# Patient Record
Sex: Female | Born: 1985 | Hispanic: Yes | Marital: Single | Smoking: Current every day smoker
Health system: Southern US, Community
[De-identification: ages and names within clinical notes are randomized; demographics above are authoritative.]

## PROBLEM LIST (undated history)

## (undated) DIAGNOSIS — F410 Panic disorder [episodic paroxysmal anxiety] without agoraphobia: Secondary | ICD-10-CM

## (undated) DIAGNOSIS — F4323 Adjustment disorder with mixed anxiety and depressed mood: Secondary | ICD-10-CM

## (undated) DIAGNOSIS — M419 Scoliosis, unspecified: Secondary | ICD-10-CM

## (undated) DIAGNOSIS — M25511 Pain in right shoulder: Secondary | ICD-10-CM

## (undated) DIAGNOSIS — M797 Fibromyalgia: Secondary | ICD-10-CM

## (undated) DIAGNOSIS — G8929 Other chronic pain: Secondary | ICD-10-CM

## (undated) DIAGNOSIS — J42 Unspecified chronic bronchitis: Secondary | ICD-10-CM

## (undated) DIAGNOSIS — G5600 Carpal tunnel syndrome, unspecified upper limb: Secondary | ICD-10-CM

## (undated) HISTORY — PX: DILATION AND CURETTAGE OF UTERUS: SHX78

---

## 2010-11-07 ENCOUNTER — Encounter: Payer: Self-pay | Admitting: *Deleted

## 2010-11-07 DIAGNOSIS — M4120 Other idiopathic scoliosis, site unspecified: Secondary | ICD-10-CM | POA: Insufficient documentation

## 2010-11-07 DIAGNOSIS — M549 Dorsalgia, unspecified: Secondary | ICD-10-CM | POA: Insufficient documentation

## 2010-11-07 LAB — URINE MICROSCOPIC-ADD ON

## 2010-11-07 LAB — URINALYSIS, ROUTINE W REFLEX MICROSCOPIC
Bilirubin Urine: NEGATIVE
Glucose, UA: NEGATIVE mg/dL
Ketones, ur: 15 mg/dL — AB
Leukocytes, UA: NEGATIVE
Nitrite: NEGATIVE
Protein, ur: 30 mg/dL — AB
Specific Gravity, Urine: 1.043 — ABNORMAL HIGH (ref 1.005–1.030)
Urobilinogen, UA: 0.2 mg/dL (ref 0.0–1.0)
pH: 6 (ref 5.0–8.0)

## 2010-11-07 LAB — PREGNANCY, URINE: Preg Test, Ur: NEGATIVE

## 2010-11-07 NOTE — ED Notes (Signed)
Pt states she has had low back pain onset this a.m. Also c/o H/A. States she passed out 2 days ago and was seen at [facility]. Hx scoliosis.

## 2010-11-08 ENCOUNTER — Encounter (HOSPITAL_BASED_OUTPATIENT_CLINIC_OR_DEPARTMENT_OTHER): Payer: Self-pay

## 2010-11-08 ENCOUNTER — Emergency Department (INDEPENDENT_AMBULATORY_CARE_PROVIDER_SITE_OTHER): Payer: Medicaid Other

## 2010-11-08 ENCOUNTER — Emergency Department (HOSPITAL_BASED_OUTPATIENT_CLINIC_OR_DEPARTMENT_OTHER)
Admission: EM | Admit: 2010-11-08 | Discharge: 2010-11-08 | Disposition: A | Discharge status: Home / Self Care | Payer: Medicaid Other | Attending: Emergency Medicine | Admitting: Emergency Medicine

## 2010-11-08 DIAGNOSIS — M545 Low back pain: Secondary | ICD-10-CM

## 2010-11-08 DIAGNOSIS — M4120 Other idiopathic scoliosis, site unspecified: Secondary | ICD-10-CM

## 2010-11-08 DIAGNOSIS — R55 Syncope and collapse: Secondary | ICD-10-CM

## 2010-11-08 DIAGNOSIS — M419 Scoliosis, unspecified: Secondary | ICD-10-CM

## 2010-11-08 DIAGNOSIS — M549 Dorsalgia, unspecified: Secondary | ICD-10-CM

## 2010-11-08 DIAGNOSIS — R109 Unspecified abdominal pain: Secondary | ICD-10-CM

## 2010-11-08 HISTORY — DX: Scoliosis, unspecified: M41.9

## 2010-11-08 MED ORDER — KETOROLAC TROMETHAMINE 15 MG/ML IJ SOLN
15.0000 mg | Freq: Once | INTRAMUSCULAR | Status: AC
Start: 2010-11-08 — End: 2010-11-08
  Administered 2010-11-08: 15 mg via INTRAVENOUS
  Filled 2010-11-08: qty 1

## 2010-11-08 MED ORDER — SODIUM CHLORIDE 0.9 % IV SOLN
Freq: Once | INTRAVENOUS | Status: AC
  Administered 2010-11-08: 02:00:00 via INTRAVENOUS

## 2010-11-08 MED ORDER — ONDANSETRON HCL 4 MG/2ML IJ SOLN
4.0000 mg | Freq: Once | INTRAMUSCULAR | Status: AC
Start: 2010-11-08 — End: 2010-11-08
  Administered 2010-11-08: 4 mg via INTRAVENOUS
  Filled 2010-11-08: qty 2

## 2010-11-08 MED ORDER — HYDROMORPHONE HCL 1 MG/ML IJ SOLN
1.0000 mg | Freq: Once | INTRAMUSCULAR | Status: AC
  Administered 2010-11-08: 1 mg via INTRAVENOUS
  Filled 2010-11-08: qty 1

## 2010-11-08 MED ORDER — OXYCODONE-ACETAMINOPHEN 7.5-325 MG PO TABS: 1.0000 | ORAL_TABLET | ORAL | Status: AC | PRN

## 2010-11-08 NOTE — ED Provider Notes (Addendum)
History     CSN: [id number] Arrival date & time: 11/08/2010 12:19 AM  Chief Complaint  Patient presents with  . Back Pain   HPI This is a 25 year old white female with a history of back pain that started yesterday morning. It initially was felt in the lower lumbar region but has radiated to the right flank. It is constant but waxes and wanes. She comparison to labor pain and is severe at its worst. She was not aware of any hematuria. She is not on her menses at this time. She denies vaginal bleeding or discharge. She denies fever or chills. She has been nauseated but not vomited with the pain. There is equivocal worsening of the pain with movement. She has never had similar pain in the past. She denies back injury or recent lifting.   Past Medical History  Diagnosis Date  . Scoliosis     Past Surgical History  Procedure Date  . Dilation and curettage of uterus     History reviewed. No pertinent family history.  History  Substance Use Topics  . Smoking status: Current Everyday Smoker  . Smokeless tobacco: Not on file  . Alcohol Use: Yes    OB History    Grav Para Term Preterm Abortions TAB SAB Ect Mult Living                  Review of Systems  All other systems reviewed and are negative.    Physical Exam  BP 101/85  Pulse 82  Temp(Src) 98 F (36.7 C) (Oral)  Resp 18  Ht 4\' 11"  (1.499 m)  Wt 107 lb (48.535 kg)  BMI 21.61 kg/m2  SpO2 100%  LMP 10/20/2010  Physical Exam General: Well-developed, well-nourished female in no acute distress; appearance consistent with age of record HENT: normocephalic, atraumatic Eyes: pupils equal round and reactive to light; extraocular muscles intact Neck: supple Heart: regular rate and rhythm; no murmurs, rubs or gallops Lungs: clear to auscultation bilaterally Abdomen: soft; mild right suprapubic tenderness tender; nondistended; no masses or hepatosplenomegaly; bowel sounds present Genitourinary: Mild right CVA  tenderness Extremities: No deformity; full range of motion; pulses normal Neurologic: Awake, alert and oriented;motor function intact in all extremities and symmetric;sensation grossly intact; no facial droop Skin: Warm and dry Psychiatric: Normal mood and affect   ED Course  Procedures  MDM Nursing notes and vitals signs, including pulse oximetry, reviewed.  Summary of this visit's results, reviewed by myself:  Labs:  Results for orders placed during the hospital encounter of 11/08/10  URINALYSIS, ROUTINE W REFLEX MICROSCOPIC      Component Value Range   Color, Urine YELLOW  YELLOW    Appearance CLOUDY (*) CLEAR    Specific Gravity, Urine 1.043 (*) 1.005 - 1.030    pH 6.0  5.0 - 8.0    Glucose, UA NEGATIVE  NEGATIVE (mg/dL)   Hgb urine dipstick LARGE (*) NEGATIVE    Bilirubin Urine NEGATIVE  NEGATIVE    Ketones, ur 15 (*) NEGATIVE (mg/dL)   Protein, ur 30 (*) NEGATIVE (mg/dL)   Urobilinogen, UA 0.2  0.0 - 1.0 (mg/dL)   Nitrite NEGATIVE  NEGATIVE    Leukocytes, UA NEGATIVE  NEGATIVE   PREGNANCY, URINE      Component Value Range   Preg Test, Ur NEGATIVE    URINE MICROSCOPIC-ADD ON      Component Value Range   Squamous Epithelial / LPF RARE  RARE    WBC, UA 0-2  <3 (WBC/hpf)  RBC / HPF TOO NUMEROUS TO COUNT  <3 (RBC/hpf)   Bacteria, UA RARE  RARE    Urine-Other MUCOUS PRESENT      Imaging Studies: Ct Abdomen Pelvis Wo Contrast  11/08/2010  *RADIOLOGY REPORT*  Clinical Data:  Low back pain, syncope, right flank pain, history scoliosis  CT ABDOMEN AND PELVIS WITHOUT CONTRAST  Technique:  Multidetector CT imaging of the abdomen and pelvis was performed following the standard protocol without intravenous contrast. Sagittal and coronal MPR images reconstructed from axial data set.  Comparison: None  Findings: Lung bases clear. Thoracolumbar deformity secondary to the rotary scoliosis of the thoracolumbar spine. Unremarkable kidneys without mass, hydronephrosis or calcification.  No definite ureteral calcification or dilatation. Bladder unremarkable. Liver, spleen, pancreas, and adrenal glands grossly normal appearance for technique. Suboptimal assessment stomach and bowel loops due to lack of IV and oral contrast and lack of intra abdominal fat planes, no gross abnormalities seen. No definite mass, adenopathy, free fluid, or inflammatory process. No acute bony findings.  IMPRESSION: Scoliosis. No definite acute intra abdominal or intrapelvic abnormalities.  Original Report Authenticated By: Lollie Marrow, M.D.          Hanley Seamen, MD 11/08/10 0250  Hanley Seamen, MD 11/08/10 [phone]

## 2010-11-08 NOTE — ED Notes (Signed)
When this RN entered pt's room, pt was asleep and had to be stimulated to wake up.  Pt immediately stated that she was in the worst pain of her life, that "it feels like labor pains".  Pt remained drowsy throughout IV insertion and medication administration.

## 2016-05-25 ENCOUNTER — Emergency Department (HOSPITAL_BASED_OUTPATIENT_CLINIC_OR_DEPARTMENT_OTHER): Payer: Medicaid Other

## 2016-05-25 ENCOUNTER — Emergency Department (HOSPITAL_BASED_OUTPATIENT_CLINIC_OR_DEPARTMENT_OTHER)
Admission: EM | Admit: 2016-05-25 | Discharge: 2016-05-26 | Disposition: A | Discharge status: Home / Self Care | Payer: Medicaid Other | Attending: Emergency Medicine | Admitting: Emergency Medicine

## 2016-05-25 ENCOUNTER — Encounter (HOSPITAL_BASED_OUTPATIENT_CLINIC_OR_DEPARTMENT_OTHER): Payer: Self-pay | Admitting: Emergency Medicine

## 2016-05-25 DIAGNOSIS — R05 Cough: Secondary | ICD-10-CM | POA: Diagnosis present

## 2016-05-25 DIAGNOSIS — F17200 Nicotine dependence, unspecified, uncomplicated: Secondary | ICD-10-CM | POA: Diagnosis not present

## 2016-05-25 DIAGNOSIS — B9789 Other viral agents as the cause of diseases classified elsewhere: Secondary | ICD-10-CM

## 2016-05-25 DIAGNOSIS — J069 Acute upper respiratory infection, unspecified: Secondary | ICD-10-CM | POA: Diagnosis not present

## 2016-05-25 DIAGNOSIS — Z79899 Other long term (current) drug therapy: Secondary | ICD-10-CM | POA: Diagnosis not present

## 2016-05-25 HISTORY — DX: Fibromyalgia: M79.7

## 2016-05-25 HISTORY — DX: Other chronic pain: G89.29

## 2016-05-25 HISTORY — DX: Carpal tunnel syndrome, unspecified upper limb: G56.00

## 2016-05-25 HISTORY — DX: Adjustment disorder with mixed anxiety and depressed mood: F43.23

## 2016-05-25 HISTORY — DX: Pain in right shoulder: M25.511

## 2016-05-25 HISTORY — DX: Panic disorder (episodic paroxysmal anxiety): F41.0

## 2016-05-25 HISTORY — DX: Unspecified chronic bronchitis: J42

## 2016-05-25 NOTE — ED Notes (Signed)
Pt taken to treatment room and appeared very drowsy. Pt had to awakened x 2 to move to stretcher from wheel chair. Pt dropped drink in floor.

## 2016-05-25 NOTE — ED Provider Notes (Signed)
By signing my name below, I, Freida Busman, attest that this documentation has been prepared under the direction and in the presence of Davionna Blacksher N Odena Mcquaid, DO . Electronically Signed: Freida Busman, Scribe. 05/26/2016. 12:13 AM.   TIME SEEN: 12:06 AM  CHIEF COMPLAINT:  Chief Complaint  Patient presents with  . Cough  . Chest Pain     HPI: HPI Comments:  Rose Willis is a 31 y.o. female with reported history of chronic bronchitis, tobacco use who presents to the Emergency Department complaining of chest heaviness x 1 day. She reports difficulty taking a deep breath. She denies fever. Pt reports associated cough productive of deep green sputum and SOB. She reports h/o chronic bronchitis; has been using inhaler with little relief. Pt is a current smoker. She denies recent fractures but notes she sprained her left ankle ~ 2 weeks ago and has been ambulating with the aid of crutches and is in a boot. Denies calf tenderness or calf swelling. No history of PE or DVT.   ROS: See HPI Constitutional: no fever  Eyes: no drainage  ENT: no runny nose   Cardiovascular:  chest pain  Resp: SOB  GI: no vomiting GU: no dysuria Integumentary: no rash  Allergy: no hives  Musculoskeletal: no leg swelling  Neurological: no slurred speech ROS otherwise negative  PAST MEDICAL HISTORY/PAST SURGICAL HISTORY:  Past Medical History:  Diagnosis Date  . Adjustment disorder with mixed anxiety and depressed mood   . Carpal tunnel syndrome   . Chronic bronchitis (HCC)   . Chronic right shoulder pain   . Fibromyalgia   . Panic attack   . Scoliosis   . Scoliosis     MEDICATIONS:  Prior to Admission medications   Medication Sig Start Date End Date Taking? Authorizing Provider  albuterol (PROVENTIL HFA;VENTOLIN HFA) 108 (90 Base) MCG/ACT inhaler Inhale 2 puffs into the lungs every 6 (six) hours as needed for wheezing or shortness of breath.   Yes Historical Provider, MD  atenolol (TENORMIN) 25 MG tablet  Take 25 mg by mouth daily.   Yes Historical Provider, MD  diclofenac (VOLTAREN) 75 MG EC tablet Take 75 mg by mouth 2 (two) times daily.   Yes Historical Provider, MD  gabapentin (NEURONTIN) 300 MG capsule Take 300 mg by mouth 3 (three) times daily.    Yes Historical Provider, MD  levonorgestrel (MIRENA) 20 MCG/24HR IUD 1 each by Intrauterine route once.   Yes Historical Provider, MD  Milnacipran (SAVELLA) 50 MG TABS tablet Take 50 mg by mouth 2 (two) times daily.   Yes Historical Provider, MD  SUMAtriptan (IMITREX) 100 MG tablet Take 100 mg by mouth every 2 (two) hours as needed for migraine. May repeat in 2 hours if headache persists or recurs.   Yes Historical Provider, MD  topiramate (TOPAMAX) 25 MG capsule Take 25 mg by mouth as directed.   Yes Historical Provider, MD    ALLERGIES:  No Known Allergies  SOCIAL HISTORY:  Social History  Substance Use Topics  . Smoking status: Current Every Day Smoker  . Smokeless tobacco: Never Used  . Alcohol use Yes    FAMILY HISTORY: No family history on file.  EXAM: BP 120/91 (BP Location: Right Arm)   Pulse 100   Temp 98.5 F (36.9 C) (Oral)   Resp 18   Ht 5\' 1"  (1.549 m)   Wt 115 lb (52.2 kg)   LMP 05/24/2016 (Exact Date)   SpO2 100%   BMI 21.73 kg/m  CONSTITUTIONAL:  Sleepy bit arousable; oriented and responds appropriately to questions. Well-appearing; well-nourished, Does not appear intoxicated, afebrile and nontoxic appearing HEAD: Normocephalic EYES: Conjunctivae clear, PERRL, EOMI ENT: normal nose; no rhinorrhea; moist mucous membranes NECK: Supple, no meningismus, no nuchal rigidity, no LAD  CARD: RRR; S1 and S2 appreciated; no murmurs, no clicks, no rubs, no gallops RESP: Normal chest excursion without splinting or tachypnea; diminished breath sounds bilaterally; no wheezes, no rhonchi, no rales, no hypoxia or respiratory distress, speaking full sentences ABD/GI: Normal bowel sounds; non-distended; soft, non-tender, no  rebound, no guarding, no peritoneal signs, no hepatosplenomegaly BACK:  The back is non-tender to palpation, there is no CVA tenderness. She has significant scoliosis.  EXT: Normal ROM in all joints; non-tender to palpation; no edema; normal capillary refill; no cyanosis, no calf tenderness or swelling. Left foot is in a boot    SKIN: Normal color for age and race; warm; no rash NEURO: Moves all extremities equally, sensation to light touch intact diffusely, cranial nerves II through XII intact, normal speech PSYCH: The patient's mood and manner are appropriate. Grooming and personal hygiene are appropriate.  MEDICAL DECISION MAKING: Patient here with symptoms of viral URI. Chest x-ray obtained in triage is unremarkable.  Patient has chest pain with coughing. I do not think this is pneumonia. I do not feel she needs antibiotics. She states she feels a breathing treatment will make her feel better. We'll give DuoNeb, prednisone and reassess. We'll also give guaifenesin. Given she does have some chest pain although mostly with coughing, shortness of breath and her foot is in a boot, will obtain a d-dimer to rule out PE although much suspicion for this is low.  ED PROGRESS: Patient's labs have been unremarkable. Negative troponin and negative d-dimer. She reports feeling much better. No hypoxia or increased work of breathing here. She is refusing prednisone stating that she has taken it before and it caused her to have tachycardia immediately after taking it. Discussed with her that this is likely an adverse reaction and if it occurred immediately after taking the medication and was unlikely prednisone that caused her symptoms. She had no hives, lip or tongue swelling at that time. She agrees to a dose of Decadron here and has tolerated without difficulty. I feel she is safe to be discharged home and follow up with her primary care physician. She verbalized understanding and is comfortable with this  plan.   Unfortunately an EKG was not obtained on this patient prior to being discharged. Very low suspicion for ACS given chest pain was with coughing, she is young without risk factors. She did have a negative troponin and d-dimer here. Again suspect viral URI.   At this time, I do not feel there is any life-threatening condition present. I have reviewed and discussed all results (EKG, imaging, lab, urine as appropriate) and exam findings with patient/family. I have reviewed nursing notes and appropriate previous records.  I feel the patient is safe to be discharged home without further emergent workup and can continue workup as an outpatient as needed. Discussed usual and customary return precautions. Patient/family verbalize understanding and are comfortable with this plan.  Outpatient follow-up has been provided. All questions have been answered.  I personally performed the services described in this documentation, which was scribed in my presence. The recorded information has been reviewed and is accurate.      Layla MawKristen N Devann Cribb, DO 05/26/16 [phone]

## 2016-05-25 NOTE — ED Triage Notes (Signed)
Pt c/o tightness in chest, productive cough and shob. Pt states she used her inhaler just before coming to ED without relief. Pt has hx of chronic bronchitis.

## 2016-05-25 NOTE — ED Triage Notes (Signed)
Pt visualized in lobby to be sleeping in wheel chair. NAD noted.

## 2016-05-25 NOTE — ED Triage Notes (Signed)
Nurse first-pt c/o chest tightness, SOB-reports hx of chronic bronchitis-states she has been using her inhaler-NAD-O2 sat 100%, HR 98, RR 18-pt has cam walker on left LE and walking on crutches-placed in w/c

## 2016-05-26 LAB — CBC WITH DIFFERENTIAL/PLATELET
Basophils Absolute: 0 10*3/uL (ref 0.0–0.1)
Basophils Relative: 1 %
EOS ABS: 0.3 10*3/uL (ref 0.0–0.7)
Eosinophils Relative: 4 %
HCT: 39.7 % (ref 36.0–46.0)
Hemoglobin: 13.3 g/dL (ref 12.0–15.0)
Lymphocytes Relative: 21 %
Lymphs Abs: 1.8 10*3/uL (ref 0.7–4.0)
MCH: 32.1 pg (ref 26.0–34.0)
MCHC: 33.5 g/dL (ref 30.0–36.0)
MCV: 95.9 fL (ref 78.0–100.0)
MONO ABS: 0.8 10*3/uL (ref 0.1–1.0)
MONOS PCT: 10 %
Neutro Abs: 5.5 10*3/uL (ref 1.7–7.7)
Neutrophils Relative %: 64 %
Platelets: 216 10*3/uL (ref 150–400)
RBC: 4.14 MIL/uL (ref 3.87–5.11)
RDW: 12.9 % (ref 11.5–15.5)
WBC: 8.5 10*3/uL (ref 4.0–10.5)

## 2016-05-26 LAB — BASIC METABOLIC PANEL
Anion gap: 7 (ref 5–15)
BUN: 15 mg/dL (ref 6–20)
CALCIUM: 8.8 mg/dL — AB (ref 8.9–10.3)
CO2: 24 mmol/L (ref 22–32)
Chloride: 106 mmol/L (ref 101–111)
Creatinine, Ser: 0.86 mg/dL (ref 0.44–1.00)
GFR calc non Af Amer: >60 mL/min (ref >60)
Glucose, Bld: 111 mg/dL — ABNORMAL HIGH (ref 65–99)
Potassium: 3.6 mmol/L (ref 3.5–5.1)
SODIUM: 137 mmol/L (ref 135–145)

## 2016-05-26 LAB — D-DIMER, QUANTITATIVE (NOT AT ARMC): D DIMER QUANT: 0.35 ug{FEU}/mL (ref 0.00–0.50)

## 2016-05-26 LAB — TROPONIN I

## 2016-05-26 MED ORDER — PREDNISONE 20 MG PO TABS: 60.0000 mg | ORAL_TABLET | Freq: Every day | ORAL | 0 refills | Status: DC

## 2016-05-26 MED ORDER — GUAIFENESIN 100 MG/5ML PO LIQD: 100.0000 mg | ORAL | 0 refills | Status: AC | PRN

## 2016-05-26 MED ORDER — ALBUTEROL SULFATE HFA 108 (90 BASE) MCG/ACT IN AERS: 1.0000 | INHALATION_SPRAY | Freq: Four times a day (QID) | RESPIRATORY_TRACT | 0 refills | Status: DC | PRN

## 2016-05-26 MED ORDER — IPRATROPIUM-ALBUTEROL 0.5-2.5 (3) MG/3ML IN SOLN
3.0000 mL | RESPIRATORY_TRACT | Status: DC
  Administered 2016-05-26: 3 mL via RESPIRATORY_TRACT
  Filled 2016-05-26: qty 3

## 2016-05-26 MED ORDER — IBUPROFEN 800 MG PO TABS
800.0000 mg | ORAL_TABLET | Freq: Once | ORAL | Status: AC
  Administered 2016-05-26: 800 mg via ORAL
  Filled 2016-05-26: qty 1

## 2016-05-26 MED ORDER — GUAIFENESIN 100 MG/5ML PO SOLN
10.0000 mL | Freq: Once | ORAL | Status: AC
  Administered 2016-05-26: 200 mg via ORAL
  Filled 2016-05-26: qty 10

## 2016-05-26 MED ORDER — DEXAMETHASONE 6 MG PO TABS
10.0000 mg | ORAL_TABLET | Freq: Once | ORAL | Status: AC
  Administered 2016-05-26: 10 mg via ORAL
  Filled 2016-05-26: qty 1

## 2016-05-26 MED ORDER — PREDNISONE 50 MG PO TABS
60.0000 mg | ORAL_TABLET | Freq: Once | ORAL | Status: DC
  Filled 2016-05-26: qty 1

## 2016-05-26 NOTE — ED Notes (Addendum)
Pt reports cough nasal congestion and chest pain x 18 hours.reports that she has had to use her inhaler every couple of hours  Pt is difficult to arouse, initially confused about reason for visit keeps repeating something about children.  pt continues to fall asleep during assessment. When asked about excessive sleepiness pt reports that she has not slept in 48 hours.

## 2016-05-26 NOTE — Discharge Instructions (Signed)
To find a primary care or specialty doctor please call [phone] or [phone] to access "[facility] Find a Doctor Service." ° °You may also go on the [facility] website at [email or web address] ° °There are also multiple [facility], [facility], [facility] and [facility] practices throughout the [place] that are frequently accepting new patients. You may find a clinic that is close to your home and contact them. ° °[facility] -  °[address] °[place] [address] °[phone] ° ° °[facility] -  °[address] °[place] [address] [phone] ° ° °[facility] - °[address]  °[place]  [address] [phone] ° ° °

## 2017-07-10 ENCOUNTER — Emergency Department (HOSPITAL_BASED_OUTPATIENT_CLINIC_OR_DEPARTMENT_OTHER): Payer: Self-pay

## 2017-07-10 ENCOUNTER — Other Ambulatory Visit: Payer: Self-pay

## 2017-07-10 ENCOUNTER — Encounter (HOSPITAL_BASED_OUTPATIENT_CLINIC_OR_DEPARTMENT_OTHER): Payer: Self-pay | Admitting: Emergency Medicine

## 2017-07-10 ENCOUNTER — Emergency Department (HOSPITAL_BASED_OUTPATIENT_CLINIC_OR_DEPARTMENT_OTHER)
Admission: EM | Admit: 2017-07-10 | Discharge: 2017-07-10 | Disposition: A | Discharge status: Home / Self Care | Payer: Self-pay | Attending: Emergency Medicine | Admitting: Emergency Medicine

## 2017-07-10 DIAGNOSIS — Z79899 Other long term (current) drug therapy: Secondary | ICD-10-CM | POA: Insufficient documentation

## 2017-07-10 DIAGNOSIS — J189 Pneumonia, unspecified organism: Secondary | ICD-10-CM

## 2017-07-10 DIAGNOSIS — F17200 Nicotine dependence, unspecified, uncomplicated: Secondary | ICD-10-CM | POA: Insufficient documentation

## 2017-07-10 DIAGNOSIS — J181 Lobar pneumonia, unspecified organism: Secondary | ICD-10-CM | POA: Insufficient documentation

## 2017-07-10 LAB — PREGNANCY, URINE: Preg Test, Ur: NEGATIVE

## 2017-07-10 MED ORDER — LEVOFLOXACIN 500 MG PO TABS
500.0000 mg | ORAL_TABLET | Freq: Once | ORAL | Status: AC
  Administered 2017-07-10: 500 mg via ORAL
  Filled 2017-07-10: qty 1

## 2017-07-10 MED ORDER — AEROCHAMBER PLUS FLO-VU MEDIUM MISC
1.0000 | Freq: Once | Status: AC
  Administered 2017-07-10: 1
  Filled 2017-07-10: qty 1

## 2017-07-10 MED ORDER — DOXYCYCLINE HYCLATE 100 MG PO CAPS: 100.0000 mg | ORAL_CAPSULE | Freq: Two times a day (BID) | ORAL | 0 refills | Status: DC

## 2017-07-10 MED ORDER — ALBUTEROL SULFATE HFA 108 (90 BASE) MCG/ACT IN AERS
2.0000 | INHALATION_SPRAY | RESPIRATORY_TRACT | Status: DC | PRN
  Administered 2017-07-10: 2 via RESPIRATORY_TRACT
  Filled 2017-07-10: qty 6.7

## 2017-07-10 MED ORDER — IPRATROPIUM-ALBUTEROL 0.5-2.5 (3) MG/3ML IN SOLN
3.0000 mL | Freq: Once | RESPIRATORY_TRACT | Status: AC
  Administered 2017-07-10: 3 mL via RESPIRATORY_TRACT
  Filled 2017-07-10: qty 3

## 2017-07-10 NOTE — Discharge Instructions (Addendum)
AVOID SUN EXPOSURE WHILE TAKING ANTIBIOTIC. TAKE MEDICATION AS PRESCRIBED UNTIL COMPLETE. RETURN TO ER IF ANY WORSENING BREATHING PROBLEMS.

## 2017-07-10 NOTE — ED Triage Notes (Signed)
SOB and cough x 3 days. Hx of bronchitis, states she is out of her inhaler.

## 2017-07-10 NOTE — ED Provider Notes (Signed)
[facility] EMERGENCY DEPARTMENT Provider Note   CSN: [id number] Arrival date & time: 07/10/17  8657     History   Chief Complaint Chief Complaint  Patient presents with  . Shortness of Breath  . Cough    HPI Rose Willis is a 32 y.o. female.  32yo F w/ PMH including fibromyalgia, panic attacks who presents with cough and shortness of breath.  The patient has had a cough for the past 3 days that has recently been productive of green phlegm.  She reports associated nasal congestion, chest tightness, and wheezing feeling with shortness of breath.  She ran out of her inhaler several months ago.  She denies any fevers, vomiting, sick contacts, or recent travel.  No medications prior to arrival.  The history is provided by the patient.    Past Medical History:  Diagnosis Date  . Adjustment disorder with mixed anxiety and depressed mood   . Carpal tunnel syndrome   . Chronic bronchitis (HCC)   . Chronic right shoulder pain   . Fibromyalgia   . Panic attack   . Scoliosis   . Scoliosis     There are no active problems to display for this patient.   Past Surgical History:  Procedure Laterality Date  . DILATION AND CURETTAGE OF UTERUS       OB History   None      Home Medications    Prior to Admission medications   Medication Sig Start Date End Date Taking? Authorizing Provider  atenolol (TENORMIN) 25 MG tablet Take 25 mg by mouth daily.   Yes [provider]  SUMAtriptan (IMITREX) 100 MG tablet Take 100 mg by mouth every 2 (two) hours as needed for migraine. May repeat in 2 hours if headache persists or recurs.   Yes [provider]  diclofenac (VOLTAREN) 75 MG EC tablet Take 75 mg by mouth 2 (two) times daily.    [provider]  doxycycline (VIBRAMYCIN) 100 MG capsule Take 1 capsule (100 mg total) by mouth 2 (two) times daily. 07/10/17   Claborn Janusz, Ambrose Finland, MD  gabapentin (NEURONTIN) 300 MG capsule Take 300 mg by mouth 3  (three) times daily.     [provider]  guaiFENesin (ROBITUSSIN) 100 MG/5ML liquid Take 5-10 mLs (100-200 mg total) by mouth every 4 (four) hours as needed for cough. 05/26/16   Ward, Layla Maw, DO  levonorgestrel (MIRENA) 20 MCG/24HR IUD 1 each by Intrauterine route once.    [provider]  Milnacipran (SAVELLA) 50 MG TABS tablet Take 50 mg by mouth 2 (two) times daily.    [provider]  predniSONE (DELTASONE) 20 MG tablet Take 3 tablets (60 mg total) by mouth daily. 05/26/16   Ward, Layla Maw, DO  topiramate (TOPAMAX) 25 MG capsule Take 25 mg by mouth as directed.    [provider]    Family History No family history on file.  Social History Social History   Tobacco Use  . Smoking status: Current Every Day Smoker  . Smokeless tobacco: Never Used  Substance Use Topics  . Alcohol use: Yes  . Drug use: No     Allergies   Prednisone and Tramadol   Review of Systems Review of Systems All other systems reviewed and are negative except that which was mentioned in HPI   Physical Exam Updated Vital Signs BP (!) 115/92   Pulse 78   Temp 98.5 F (36.9 C) (Oral)   Resp 16  Ht 5\' 1"  (1.549 m)   Wt 52.2 kg (115 lb)   LMP 06/20/2017   SpO2 100%   BMI 21.73 kg/m   Physical Exam  Constitutional: She is oriented to person, place, and time. She appears well-developed and well-nourished. No distress.  HENT:  Head: Normocephalic and atraumatic.  Mouth/Throat: No oropharyngeal exudate.  Moist mucous membranes  Eyes: Conjunctivae are normal.  Neck: Neck supple.  Cardiovascular: Normal rate, regular rhythm and normal heart sounds.  No murmur heard. Pulmonary/Chest: Effort normal. She has no wheezes. She has no rales.  Mildly diminished in bases  Abdominal: Soft. Bowel sounds are normal. She exhibits no distension. There is no tenderness.  Musculoskeletal: She exhibits no edema.  Neurological: She is alert and oriented to person, place, and  time.  Fluent speech  Skin: Skin is warm and dry.  Psychiatric: She has a normal mood and affect. Judgment normal.  Nursing note and vitals reviewed.    ED Treatments / Results  Labs (all labs ordered are listed, but only abnormal results are displayed) Labs Reviewed  PREGNANCY, URINE    EKG None  Radiology Dg Chest 2 View  Result Date: 07/10/2017 CLINICAL DATA:  31 year old female with cough and congestion for the past 3 days. History of chronic bronchitis. EXAM: CHEST - 2 VIEW COMPARISON:  Prior chest x-ray 05/25/2016 FINDINGS: Stable cardiac mediastinal contours. Stable S shaped scoliosis with a dextroconvex thoracic curvature and a rotary levoconvex lumbar curvature. There is slightly increased airspace opacification in the right lower lobe. Otherwise, the lungs are clear. No pleural effusion or pneumothorax. No acute osseous abnormality. IMPRESSION: 1. Patchy right lower lobe airspace opacity which could reflect early bronchopneumonia in the appropriate clinical setting. Electronically Signed   By: Malachy Moan M.D.   On: 07/10/2017 09:28    Procedures Procedures (including critical care time)  Medications Ordered in ED Medications  albuterol (PROVENTIL HFA;VENTOLIN HFA) 108 (90 Base) MCG/ACT inhaler 2 puff (2 puffs Inhalation Given 07/10/17 1100)  ipratropium-albuterol (DUONEB) 0.5-2.5 (3) MG/3ML nebulizer solution 3 mL (3 mLs Nebulization Given 07/10/17 0903)  levofloxacin (LEVAQUIN) tablet 500 mg (500 mg Oral Given 07/10/17 0958)  AEROCHAMBER PLUS FLO-VU MEDIUM MISC 1 each (1 each Other Given 07/10/17 1100)     Initial Impression / Assessment and Plan / ED Course  I have reviewed the triage vital signs and the nursing notes.  Pertinent labs & imaging results that were available during my care of the patient were reviewed by me and considered in my medical decision making (see chart for details).     Pt breathing comfortably on exam, normal VS. No wheezing.  Chest  x-ray shows possible early right lower lobe pneumonia.  Given symptoms, will initiate antibiotics to treat CAP.  On reexamination after DuoNeb, the patient states she feels better, I do not hear any wheezing therefore I do not feel she needs steroids at this time.  Have provided albuterol to use for comfort at home. Gave levaquin in ED but due to cost concerns, will prescribe doxycycline w/ Good RX coupon for home.  Discussed supportive measures and extensively reviewed return precautions.  Patient discharged in satisfactory condition. Final Clinical Impressions(s) / ED Diagnoses   Final diagnoses:  Community acquired pneumonia of right lower lobe of lung [facility])    ED Discharge Orders        Ordered    doxycycline (VIBRAMYCIN) 100 MG capsule  2 times daily     07/10/17 1044  Tannisha Kennington, Ambrose Finlandachel Morgan, MD 07/10/17 1136

## 2018-07-25 ENCOUNTER — Encounter (HOSPITAL_BASED_OUTPATIENT_CLINIC_OR_DEPARTMENT_OTHER): Payer: Self-pay | Admitting: Emergency Medicine

## 2018-07-25 ENCOUNTER — Emergency Department (HOSPITAL_BASED_OUTPATIENT_CLINIC_OR_DEPARTMENT_OTHER)
Admission: EM | Admit: 2018-07-25 | Discharge: 2018-07-25 | Discharge status: Against Medical Advice | Payer: Medicaid Other | Attending: Emergency Medicine | Admitting: Emergency Medicine

## 2018-07-25 ENCOUNTER — Other Ambulatory Visit: Payer: Self-pay

## 2018-07-25 DIAGNOSIS — F17210 Nicotine dependence, cigarettes, uncomplicated: Secondary | ICD-10-CM | POA: Insufficient documentation

## 2018-07-25 DIAGNOSIS — Z5321 Procedure and treatment not carried out due to patient leaving prior to being seen by health care provider: Secondary | ICD-10-CM | POA: Insufficient documentation

## 2018-07-25 DIAGNOSIS — R52 Pain, unspecified: Secondary | ICD-10-CM | POA: Insufficient documentation

## 2018-07-25 DIAGNOSIS — Z79899 Other long term (current) drug therapy: Secondary | ICD-10-CM | POA: Insufficient documentation

## 2018-07-25 MED ORDER — ACETAMINOPHEN 500 MG PO TABS: 1000.0000 mg | ORAL_TABLET | Freq: Once | ORAL | Status: DC
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
## 2018-07-25 NOTE — ED Notes (Signed)
Pt was escorted to restroom for a urine sample. When RN went into room patient was not there. RN check both restrooms and patient was not there. Pt was not in the waiting room. Provider notified and marked as eloped
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
## 2018-07-25 NOTE — ED Notes (Signed)
ED Provider at bedside. 

## 2018-08-14 ENCOUNTER — Emergency Department (HOSPITAL_COMMUNITY): Payer: No Typology Code available for payment source

## 2018-08-14 ENCOUNTER — Emergency Department (HOSPITAL_COMMUNITY)
Admission: EM | Admit: 2018-08-14 | Discharge: 2018-08-14 | Disposition: A | Discharge status: Home / Self Care | Payer: No Typology Code available for payment source | Attending: Emergency Medicine | Admitting: Emergency Medicine

## 2018-08-14 ENCOUNTER — Encounter (HOSPITAL_COMMUNITY): Payer: Self-pay | Admitting: Emergency Medicine

## 2018-08-14 DIAGNOSIS — Y9389 Activity, other specified: Secondary | ICD-10-CM | POA: Insufficient documentation

## 2018-08-14 DIAGNOSIS — F17210 Nicotine dependence, cigarettes, uncomplicated: Secondary | ICD-10-CM | POA: Diagnosis not present

## 2018-08-14 DIAGNOSIS — E876 Hypokalemia: Secondary | ICD-10-CM | POA: Insufficient documentation

## 2018-08-14 DIAGNOSIS — Y92410 Unspecified street and highway as the place of occurrence of the external cause: Secondary | ICD-10-CM | POA: Insufficient documentation

## 2018-08-14 DIAGNOSIS — M542 Cervicalgia: Secondary | ICD-10-CM | POA: Diagnosis not present

## 2018-08-14 DIAGNOSIS — Y999 Unspecified external cause status: Secondary | ICD-10-CM | POA: Insufficient documentation

## 2018-08-14 DIAGNOSIS — Z79899 Other long term (current) drug therapy: Secondary | ICD-10-CM | POA: Insufficient documentation

## 2018-08-14 DIAGNOSIS — S0990XA Unspecified injury of head, initial encounter: Secondary | ICD-10-CM | POA: Diagnosis present

## 2018-08-14 LAB — COMPREHENSIVE METABOLIC PANEL
ALT: 16 U/L (ref 0–44)
AST: 27 U/L (ref 15–41)
Albumin: 3.7 g/dL (ref 3.5–5.0)
Alkaline Phosphatase: 57 U/L (ref 38–126)
Anion gap: 9 (ref 5–15)
BUN: 7 mg/dL (ref 6–20)
CO2: 21 mmol/L — ABNORMAL LOW (ref 22–32)
Calcium: 8.6 mg/dL — ABNORMAL LOW (ref 8.9–10.3)
Chloride: 107 mmol/L (ref 98–111)
Creatinine, Ser: 0.77 mg/dL (ref 0.44–1.00)
GFR calc Af Amer: >60 mL/min (ref >60)
GFR calc non Af Amer: >60 mL/min (ref >60)
Glucose, Bld: 107 mg/dL — ABNORMAL HIGH (ref 70–99)
Potassium: 2.9 mmol/L — ABNORMAL LOW (ref 3.5–5.1)
Sodium: 137 mmol/L (ref 135–145)
Total Bilirubin: 0.5 mg/dL (ref 0.3–1.2)
Total Protein: 6.5 g/dL (ref 6.5–8.1)

## 2018-08-14 LAB — CBC
HCT: 39.3 % (ref 36.0–46.0)
Hemoglobin: 13.4 g/dL (ref 12.0–15.0)
MCH: 32.4 pg (ref 26.0–34.0)
MCHC: 34.1 g/dL (ref 30.0–36.0)
MCV: 94.9 fL (ref 80.0–100.0)
Platelets: 251 10*3/uL (ref 150–400)
RBC: 4.14 MIL/uL (ref 3.87–5.11)
RDW: 11.8 % (ref 11.5–15.5)
WBC: 15.2 10*3/uL — ABNORMAL HIGH (ref 4.0–10.5)
nRBC: 0 % (ref 0.0–0.2)

## 2018-08-14 LAB — RAPID URINE DRUG SCREEN, HOSP PERFORMED
Amphetamines: NOT DETECTED
Barbiturates: NOT DETECTED
Benzodiazepines: NOT DETECTED
Cocaine: NOT DETECTED
Opiates: NOT DETECTED
Tetrahydrocannabinol: NOT DETECTED

## 2018-08-14 LAB — ETHANOL: Alcohol, Ethyl (B): <10 mg/dL (ref <10)

## 2018-08-14 LAB — CBG MONITORING, ED: Glucose-Capillary: 72 mg/dL (ref 70–99)

## 2018-08-14 LAB — CK: Total CK: 237 U/L — ABNORMAL HIGH (ref 38–234)

## 2018-08-14 LAB — I-STAT BETA HCG BLOOD, ED (MC, WL, AP ONLY): I-stat hCG, quantitative: <5 m[IU]/mL (ref <5)

## 2018-08-14 MED ORDER — ACETAMINOPHEN 325 MG PO TABS
650.0000 mg | ORAL_TABLET | Freq: Once | ORAL | Status: AC
  Administered 2018-08-14: 09:00:00 650 mg via ORAL
  Filled 2018-08-14: qty 2

## 2018-08-14 MED ORDER — POTASSIUM CHLORIDE CRYS ER 20 MEQ PO TBCR
40.0000 meq | EXTENDED_RELEASE_TABLET | Freq: Once | ORAL | Status: AC
  Administered 2018-08-14: 09:00:00 40 meq via ORAL
  Filled 2018-08-14: qty 2

## 2018-08-14 MED ORDER — GADOBUTROL 1 MMOL/ML IV SOLN
6.0000 mL | Freq: Once | INTRAVENOUS | Status: AC | PRN
  Administered 2018-08-14: 6 mL via INTRAVENOUS

## 2018-08-14 MED ORDER — SODIUM CHLORIDE 0.9 % IV BOLUS
1000.0000 mL | Freq: Once | INTRAVENOUS | Status: AC
  Administered 2018-08-14: 1000 mL via INTRAVENOUS

## 2018-08-14 NOTE — ED Notes (Signed)
[facility] Sheriff @ bedside

## 2018-08-14 NOTE — ED Notes (Signed)
Pt able to wake up and answer appropriately.  Before giving medication, pt able to drink fluids without difficulty.
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
## 2018-08-14 NOTE — ED Notes (Signed)
Pt able to ambulate independent;y in room, pulse ox remains at 98% or above, pulse remains 108 bpm during this activity

## 2018-08-14 NOTE — ED Notes (Signed)
Pt rouses briefly to voice and touch. To MRI with monitor.
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
## 2018-08-14 NOTE — ED Notes (Signed)
Pt refusing to walk at this time. States she "can not move". Pt able to move extremities prior to asking her to get up and walk.
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
## 2018-08-14 NOTE — ED Notes (Addendum)
Assisted CSI with photographs.  Pt states unable to hold head up d/t pain in post neck and R shoulder.  Pt remains lethargic, though answers appropriately.  Requested pain meds from PA.

## 2018-08-14 NOTE — ED Notes (Addendum)
Set up a meal for pt on bedside table. Pt states that she is unable to lift hands to hold sandwich or cup. After reminding pt that she had lifted her hand to scratch her face, she expressed being surprised and agreed to attempt to hold sandwich "if you tear it up for me." Pt now holding a portion of sandwich and taking small bites of [place] sandwich independently.
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
## 2018-08-14 NOTE — ED Notes (Signed)
Pt able to take sips of water
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
## 2018-08-14 NOTE — ED Notes (Signed)
Pt at sink washing self. Pt state she will sperak with social work and request regular clothes. Social work aware.

## 2018-08-14 NOTE — ED Notes (Signed)
Pt walking in room washing hands at sink.

## 2018-08-14 NOTE — ED Notes (Signed)
Assisted pt to clean hands with washcloth, meal provided to pt

## 2018-08-14 NOTE — ED Notes (Addendum)
DC instructions discussed with p[t and resources. Discussed. Pt urged to returnb for any issues. Pt ambulating well in room and eating all of [place] sandwhich and applesauce. Social work in room and discussing resources.

## 2020-07-20 ENCOUNTER — Emergency Department (HOSPITAL_BASED_OUTPATIENT_CLINIC_OR_DEPARTMENT_OTHER)
Admission: EM | Admit: 2020-07-20 | Discharge: 2020-07-20 | Disposition: A | Discharge status: Home / Self Care | Payer: Medicaid Other | Attending: Emergency Medicine | Admitting: Emergency Medicine

## 2020-07-20 ENCOUNTER — Encounter (HOSPITAL_BASED_OUTPATIENT_CLINIC_OR_DEPARTMENT_OTHER): Payer: Self-pay

## 2020-07-20 ENCOUNTER — Other Ambulatory Visit: Payer: Self-pay

## 2020-07-20 DIAGNOSIS — F17210 Nicotine dependence, cigarettes, uncomplicated: Secondary | ICD-10-CM | POA: Insufficient documentation

## 2020-07-20 DIAGNOSIS — Z202 Contact with and (suspected) exposure to infections with a predominantly sexual mode of transmission: Secondary | ICD-10-CM

## 2020-07-20 LAB — URINALYSIS, ROUTINE W REFLEX MICROSCOPIC
Bilirubin Urine: NEGATIVE
Glucose, UA: NEGATIVE mg/dL
Ketones, ur: NEGATIVE mg/dL
Leukocytes,Ua: NEGATIVE
Nitrite: NEGATIVE
Protein, ur: NEGATIVE mg/dL
Specific Gravity, Urine: <1.005 — ABNORMAL LOW (ref 1.005–1.030)
pH: 7 (ref 5.0–8.0)

## 2020-07-20 LAB — URINALYSIS, MICROSCOPIC (REFLEX)

## 2020-07-20 LAB — PREGNANCY, URINE: Preg Test, Ur: NEGATIVE

## 2020-07-20 MED ORDER — DOXYCYCLINE HYCLATE 100 MG PO TABS
100.0000 mg | ORAL_TABLET | Freq: Once | ORAL | Status: AC
  Administered 2020-07-20: 100 mg via ORAL
  Filled 2020-07-20: qty 1

## 2020-07-20 MED ORDER — LIDOCAINE HCL (PF) 1 % IJ SOLN
INTRAMUSCULAR | Status: AC
  Administered 2020-07-20: 1 mL
  Filled 2020-07-20: qty 5

## 2020-07-20 MED ORDER — DOXYCYCLINE HYCLATE 100 MG PO CAPS: 100.0000 mg | ORAL_CAPSULE | Freq: Two times a day (BID) | ORAL | 0 refills | Status: DC

## 2020-07-20 MED ORDER — DOXYCYCLINE HYCLATE 100 MG PO CAPS: 100.0000 mg | ORAL_CAPSULE | Freq: Two times a day (BID) | ORAL | 0 refills | Status: AC

## 2020-07-20 MED ORDER — CEFTRIAXONE SODIUM 500 MG IJ SOLR
500.0000 mg | Freq: Once | INTRAMUSCULAR | Status: AC
  Administered 2020-07-20: 500 mg via INTRAMUSCULAR
  Filled 2020-07-20: qty 500

## 2020-07-20 NOTE — ED Provider Notes (Addendum)
[facility] EMERGENCY DEPARTMENT Provider Note   CSN: [id number] Arrival date & time: 07/20/20  1013     History Chief Complaint  Patient presents with  . Exposure to STD    Rose Willis is a 35 y.o. female with no relevant past medical history presents the ED with complaints of STI exposure.  She states that she has recently been having protected sex with a female partner who himself had tested positive for gonorrhea last week.  She is currently on her menses, so she is unclear as to possible vaginal discharge.    However, she denies any dyspareunia, pelvic pain, abdominal pain, fevers or chills, low back pain, burning with urination, increased urinary frequency, or any other symptoms.  She states that she is here hoping to simply be tested for STI.  She does have a primary care provider with whom she can follow-up.  HPI     Past Medical History:  Diagnosis Date  . Adjustment disorder with mixed anxiety and depressed mood   . Carpal tunnel syndrome   . Chronic bronchitis (HCC)   . Chronic right shoulder pain   . Fibromyalgia   . Panic attack   . Scoliosis   . Scoliosis     There are no problems to display for this patient.   Past Surgical History:  Procedure Laterality Date  . DILATION AND CURETTAGE OF UTERUS       OB History   No obstetric history on file.     History reviewed. No pertinent family history.  Social History   Tobacco Use  . Smoking status: Current Every Day Smoker    Types: Cigarettes  . Smokeless tobacco: Never Used  Substance Use Topics  . Alcohol use: Yes  . Drug use: No    Home Medications Prior to Admission medications   Medication Sig Start Date End Date Taking? Authorizing Provider  doxycycline (VIBRAMYCIN) 100 MG capsule Take 1 capsule (100 mg total) by mouth 2 (two) times daily. 07/20/20  Yes Lorelee New, PA-C  atenolol (TENORMIN) 25 MG tablet Take 25 mg by mouth daily.    [provider]  diclofenac  (VOLTAREN) 75 MG EC tablet Take 75 mg by mouth 2 (two) times daily.    [provider]  gabapentin (NEURONTIN) 300 MG capsule Take 300 mg by mouth 3 (three) times daily.     [provider]  guaiFENesin (ROBITUSSIN) 100 MG/5ML liquid Take 5-10 mLs (100-200 mg total) by mouth every 4 (four) hours as needed for cough. 05/26/16   Ward, Layla Maw, DO  levonorgestrel (MIRENA) 20 MCG/24HR IUD 1 each by Intrauterine route once.    [provider]  Milnacipran (SAVELLA) 50 MG TABS tablet Take 50 mg by mouth 2 (two) times daily.    [provider]  predniSONE (DELTASONE) 20 MG tablet Take 3 tablets (60 mg total) by mouth daily. 05/26/16   Ward, Layla Maw, DO  SUMAtriptan (IMITREX) 100 MG tablet Take 100 mg by mouth every 2 (two) hours as needed for migraine. May repeat in 2 hours if headache persists or recurs.    [provider]  topiramate (TOPAMAX) 25 MG capsule Take 25 mg by mouth as directed.    [provider]    Allergies    Tolmetin, Prednisone, and Tramadol  Review of Systems   Review of Systems  Physical Exam Updated Vital Signs BP (!) 119/94 (BP Location: Right Arm)   Pulse 76   Temp 98.4 F (  36.9 C) (Oral)   Resp 15   Ht 5\' 1"  (1.549 m)   Wt 59 kg   LMP 07/16/2020   SpO2 100%   BMI 24.56 kg/m   Physical Exam  ED Results / Procedures / Treatments   Labs (all labs ordered are listed, but only abnormal results are displayed) Labs Reviewed  URINALYSIS, ROUTINE W REFLEX MICROSCOPIC - Abnormal; Notable for the following components:      Result Value   Color, Urine STRAW (*)    Specific Gravity, Urine <1.005 (*)    Hgb urine dipstick LARGE (*)    All other components within normal limits  URINALYSIS, MICROSCOPIC (REFLEX) - Abnormal; Notable for the following components:   Bacteria, UA FEW (*)    All other components within normal limits  PREGNANCY, URINE  RPR  HIV ANTIBODY (ROUTINE TESTING W REFLEX)  GC/CHLAMYDIA PROBE AMP  ([place]) NOT AT [facility]    EKG None  Radiology No results found.  Procedures Procedures   Medications Ordered in ED Medications  cefTRIAXone (ROCEPHIN) injection 500 mg (has no administration in time range)  doxycycline (VIBRA-TABS) tablet 100 mg (has no administration in time range)    ED Course  I have reviewed the triage vital signs and the nursing notes.  Pertinent labs & imaging results that were available during my care of the patient were reviewed by me and considered in my medical decision making (see chart for details).    MDM Rules/Calculators/A&P                          Artavia Smiddy was evaluated in Emergency Department on 07/20/2020 for the symptoms described in the history of present illness. She was evaluated in the context of the global COVID-19 pandemic, which necessitated consideration that the patient might be at risk for infection with the SARS-CoV-2 virus that causes COVID-19. Institutional protocols and algorithms that pertain to the evaluation of patients at risk for COVID-19 are in a state of rapid change based on information released by regulatory bodies including the CDC and federal and state organizations. These policies and algorithms were followed during the patient's care in the ED.  I personally reviewed patient's medical chart and all notes from triage and staff during today's encounter. I have also ordered and reviewed all labs and imaging that I felt to be medically necessary in the evaluation of this patient's complaints and with consideration of their physical exam. If needed, translation services were available and utilized.   Patient to be discharged with instructions to follow up with her primary care provider and perhaps OB/GYN. Discussed importance of using protection when sexually active.  Patient's boyfriend is at bedside.  He states that he tested positive for gonorrhea just 2 days ago.  He states that he was unsure as to whether or not  he was positive for chlamydia, but was discharged home with doxycycline.  We will treat patient with Rocephin here in the ED and give first dose of doxycycline.  We will discharge her home with continued 7-day course.  Encouraging patients to abstain from any sexual intercourse x14 days.  They are monogamous relationship.  Discussed safe sex.  She denies any abdominal pain, pelvic discomfort, dyspareunia, vaginal discharge, pain with defecation, or any other concerning symptoms.  No dysuria.  Do not feel as though pelvic exam for work-up is warranted.  Patient agrees and will declined pelvic.  She did self swab instead.  ER  return precautions discussed.  Patient voices understanding and is agreeable to plan.   Final Clinical Impression(s) / ED Diagnoses Final diagnoses:  STD exposure    Rx / DC Orders ED Discharge Orders         Ordered    doxycycline (VIBRAMYCIN) 100 MG capsule  2 times daily        07/20/20 1258           Lorelee New, PA-C 07/20/20 1304    Lorelee New, PA-C 07/20/20 1304    Rolan Bucco, MD 07/20/20 1433

## 2020-07-20 NOTE — Discharge Instructions (Addendum)
You have been treated presumptively today for gonorrhea and you have been prescribed medication to cover for chlamydia.  Please take your antibiotic, as prescribed.  Take with food.  You have been tested today for gonorrhea and chlamydia. These results will be available in approximately 3 days. You may check your MyChart account for results. Please inform all sexual partners of positive results and that they should be tested and treated as well.  Please wait 2 weeks and be sure that you and your partners are symptom free before returning to sexual activity. Please use protection with every sexual encounter.  Follow Up: Please followup with your primary doctor in 3 days for discussion of your diagnoses and further evaluation after today's visit; if you do not have a primary care doctor use the resource guide provided to find one; Please return to the ER for worsening symptoms, high fevers or persistent vomiting.

## 2020-07-20 NOTE — ED Notes (Signed)
Pt will self swab for GC/Chlamydia, verbal instructions given

## 2020-07-20 NOTE — ED Notes (Signed)
Pt given printed rx for doxycycline

## 2020-07-20 NOTE — ED Notes (Signed)
Attempted to urinate, unable to obtain sample

## 2020-07-20 NOTE — ED Provider Notes (Signed)
MSE was initiated and I personally evaluated the patient and placed orders (if any) at  12:05 PM on Jul 20, 2020.  Patient is a 35 year old female with no relevant past medical history presents the ED with complaints of STI exposure.  She states that her partner recently tested positive for gonorrhea which she had gotten from an ex-girlfriend of his.  She states that she has been asymptomatic, but unclear about possible discharge given that she is currently on her menses.  However, she wanted to be tested.  Denies any fevers, dyspareunia, abdominal pain, burning with urination, or any other symptoms.  She states that she would also like to be tested for HIV and syphilis.  The patient appears stable so that the remainder of the MSE may be completed by another provider.   Lorelee New, PA-C 07/20/20 1208    Rolan Bucco, MD 07/20/20 1433

## 2020-07-20 NOTE — ED Triage Notes (Signed)
Pt had exposure to a partner with gonorrhea. States is on menstrual cycle so cant tell if she is having any vaginal discharge. States used protection. Wants to be tested.

## 2020-07-21 LAB — HIV ANTIBODY (ROUTINE TESTING W REFLEX): HIV Screen 4th Generation wRfx: NONREACTIVE

## 2020-07-21 LAB — RPR: RPR Ser Ql: NONREACTIVE

## 2020-08-29 ENCOUNTER — Other Ambulatory Visit: Payer: Self-pay

## 2020-08-29 ENCOUNTER — Encounter (HOSPITAL_BASED_OUTPATIENT_CLINIC_OR_DEPARTMENT_OTHER): Payer: Self-pay | Admitting: *Deleted

## 2020-08-29 ENCOUNTER — Emergency Department (HOSPITAL_BASED_OUTPATIENT_CLINIC_OR_DEPARTMENT_OTHER)
Admission: EM | Admit: 2020-08-29 | Discharge: 2020-08-29 | Disposition: A | Discharge status: Home / Self Care | Payer: Self-pay | Attending: Emergency Medicine | Admitting: Emergency Medicine

## 2020-08-29 ENCOUNTER — Emergency Department (HOSPITAL_BASED_OUTPATIENT_CLINIC_OR_DEPARTMENT_OTHER): Payer: Self-pay

## 2020-08-29 DIAGNOSIS — F17210 Nicotine dependence, cigarettes, uncomplicated: Secondary | ICD-10-CM | POA: Insufficient documentation

## 2020-08-29 DIAGNOSIS — W231XXA Caught, crushed, jammed, or pinched between stationary objects, initial encounter: Secondary | ICD-10-CM | POA: Insufficient documentation

## 2020-08-29 DIAGNOSIS — S6710XA Crushing injury of unspecified finger(s), initial encounter: Secondary | ICD-10-CM

## 2020-08-29 DIAGNOSIS — Z23 Encounter for immunization: Secondary | ICD-10-CM | POA: Insufficient documentation

## 2020-08-29 DIAGNOSIS — S61235A Puncture wound without foreign body of left ring finger without damage to nail, initial encounter: Secondary | ICD-10-CM | POA: Insufficient documentation

## 2020-08-29 MED ORDER — FLUCONAZOLE 150 MG PO TABS: ORAL_TABLET | ORAL | 0 refills | Status: AC

## 2020-08-29 MED ORDER — CEPHALEXIN 250 MG PO CAPS
1000.0000 mg | ORAL_CAPSULE | Freq: Once | ORAL | Status: AC
  Administered 2020-08-29: 1000 mg via ORAL
  Filled 2020-08-29: qty 4

## 2020-08-29 MED ORDER — CEPHALEXIN 500 MG PO CAPS: 500.0000 mg | ORAL_CAPSULE | Freq: Four times a day (QID) | ORAL | 0 refills | Status: DC

## 2020-08-29 MED ORDER — TETANUS-DIPHTH-ACELL PERTUSSIS 5-2.5-18.5 LF-MCG/0.5 IM SUSY
0.5000 mL | PREFILLED_SYRINGE | Freq: Once | INTRAMUSCULAR | Status: AC
  Administered 2020-08-29: 0.5 mL via INTRAMUSCULAR
  Filled 2020-08-29: qty 0.5

## 2020-08-29 NOTE — ED Provider Notes (Signed)
[facility] Provider Note: Lowella Dell, MD, FACEP  CSN: [id number] MRN: [id number] ARRIVAL: 08/29/20 at 2240 ROOM: MH02/MH02   CHIEF COMPLAINT  Finger Injury   HISTORY OF PRESENT ILLNESS  08/29/20 11:31 PM Rose Willis is a 35 y.o. female who crushed her left ring finger in a sliding glass door about 9 hours ago.  She has a laceration to the finger.  She rates associated pain is an 8 out of 10, worse with movement or palpation.  Tetanus status is unknown.  There is a puncture injury to the volar pad and ecchymosis to the dorsal side of the finger tip.   Past Medical History:  Diagnosis Date   Adjustment disorder with mixed anxiety and depressed mood    Carpal tunnel syndrome    Chronic bronchitis (HCC)    Chronic right shoulder pain    Fibromyalgia    Panic attack    Scoliosis     Past Surgical History:  Procedure Laterality Date   DILATION AND CURETTAGE OF UTERUS      No family history on file.  Social History   Tobacco Use   Smoking status: Every Day    Pack years: 0.00    Types: Cigarettes   Smokeless tobacco: Never  Vaping Use   Vaping Use: Never used  Substance Use Topics   Alcohol use: Yes   Drug use: No    Prior to Admission medications   Medication Sig Start Date End Date Taking? Authorizing Provider  cephALEXin (KEFLEX) 500 MG capsule Take 1 capsule (500 mg total) by mouth 4 (four) times daily. 08/29/20  Yes Khalee Mazo, MD  fluconazole (DIFLUCAN) 150 MG tablet Take 1 tablet as needed for vaginal yeast infection.  May repeat in 3 days if symptoms persist. 08/29/20  Yes Sayaka Hoeppner, MD  atenolol (TENORMIN) 25 MG tablet Take 25 mg by mouth daily.    [provider]  diclofenac (VOLTAREN) 75 MG EC tablet Take 75 mg by mouth 2 (two) times daily.    [provider]  gabapentin (NEURONTIN) 300 MG capsule Take 300 mg by mouth 3 (three) times daily.     [provider]  guaiFENesin (ROBITUSSIN) 100 MG/5ML liquid  Take 5-10 mLs (100-200 mg total) by mouth every 4 (four) hours as needed for cough. 05/26/16   Ward, Layla Maw, DO  levonorgestrel (MIRENA) 20 MCG/24HR IUD 1 each by Intrauterine route once.    [provider]  Milnacipran (SAVELLA) 50 MG TABS tablet Take 50 mg by mouth 2 (two) times daily.    [provider]  SUMAtriptan (IMITREX) 100 MG tablet Take 100 mg by mouth every 2 (two) hours as needed for migraine. May repeat in 2 hours if headache persists or recurs.    [provider]  topiramate (TOPAMAX) 25 MG capsule Take 25 mg by mouth as directed.    [provider]    Allergies Tolmetin, Prednisone, and Tramadol   REVIEW OF SYSTEMS  Negative except as noted here or in the History of Present Illness.   PHYSICAL EXAMINATION  Initial Vital Signs Blood pressure (!) 122/95, pulse 99, temperature 98.7 F (37.1 C), temperature source Oral, resp. rate 18, height 5\' 1"  (1.549 m), weight 59 kg, last menstrual period 08/05/2020, SpO2 100 %.  Examination General: Well-developed, well-nourished female in no acute distress; appearance consistent with age of record HENT: normocephalic; atraumatic Eyes: pupils equal, round and reactive to light; extraocular muscles intact Neck: supple Heart: regular rate and  rhythm; Lungs: clear to auscultation bilaterally Abdomen: soft; nondistended; nontender; bowel sounds present Extremities: No deformity; tenderness distal phalanx of left ring finger, flexion and extension function intact but with reduced range of motion, sensation intact distally with brisk capillary refill, small puncture wound to volar pad without edema or tenseness of volar pad, ecchymosis of dorsal aspect of distal left ring finger Neurologic: Awake, alert and oriented; motor function intact in all extremities and symmetric; no facial droop Skin: Warm and dry Psychiatric: Normal mood and affect   RESULTS  Summary of this visit's results, reviewed and  interpreted by myself:   EKG Interpretation  Date/Time:    Ventricular Rate:    PR Interval:    QRS Duration:   QT Interval:    QTC Calculation:   R Axis:     Text Interpretation:          Laboratory Studies: No results found for this or any previous visit (from the past 24 hour(s)). Imaging Studies: DG Finger Ring Left  Result Date: 08/29/2020 CLINICAL DATA:  Closed fourth digit in door with pain and swelling, initial encounter EXAM: LEFT RING FINGER 2+V COMPARISON:  None. FINDINGS: There is no evidence of fracture or dislocation. There is no evidence of arthropathy or other focal bone abnormality. Soft tissues are unremarkable. IMPRESSION: No acute abnormality noted Electronically Signed   By: Alcide Clever M.D.   On: 08/29/2020 23:24    ED COURSE and MDM  Nursing notes, initial and subsequent vitals signs, including pulse oximetry, reviewed and interpreted by myself.  Vitals:   08/29/20 2246 08/29/20 2250  BP:  (!) 122/95  Pulse:  99  Resp:  18  Temp:  98.7 F (37.1 C)  TempSrc:  Oral  SpO2:  100%  Weight: 59 kg   Height: 5\' 1"  (1.549 m)    Medications  cephALEXin (KEFLEX) capsule 1,000 mg (has no administration in time range)  Tdap (BOOSTRIX) injection 0.5 mL (0.5 mLs Intramuscular Given 08/29/20 2340)    We will start on antibiotics given her crush injury with puncture wound to the dorsal pad.  There are no signs of compartment syndrome at this time with negligible swelling and no induration.  She was advised of signs of felon that would warrant return.  PROCEDURES  Procedures   ED DIAGNOSES     ICD-10-CM   1. Crushing injury of finger, initial encounter  S67.10XA          Ralphael Southgate, 10/29/20, MD 08/29/20 [phone]

## 2020-08-29 NOTE — ED Triage Notes (Signed)
Crush injury to her left ring finger 9 hours ago. Her finger was closed in a window. Laceration. Bleeding controlled.

## 2020-09-15 ENCOUNTER — Other Ambulatory Visit: Payer: Self-pay

## 2020-09-15 ENCOUNTER — Encounter (HOSPITAL_BASED_OUTPATIENT_CLINIC_OR_DEPARTMENT_OTHER): Payer: Self-pay | Admitting: Urology

## 2020-09-15 ENCOUNTER — Emergency Department (HOSPITAL_BASED_OUTPATIENT_CLINIC_OR_DEPARTMENT_OTHER)
Admission: EM | Admit: 2020-09-15 | Discharge: 2020-09-15 | Disposition: A | Discharge status: Home / Self Care | Payer: Worker's Compensation | Attending: Emergency Medicine | Admitting: Emergency Medicine

## 2020-09-15 DIAGNOSIS — Y990 Civilian activity done for income or pay: Secondary | ICD-10-CM | POA: Insufficient documentation

## 2020-09-15 DIAGNOSIS — S61215D Laceration without foreign body of left ring finger without damage to nail, subsequent encounter: Secondary | ICD-10-CM | POA: Insufficient documentation

## 2020-09-15 DIAGNOSIS — F17210 Nicotine dependence, cigarettes, uncomplicated: Secondary | ICD-10-CM | POA: Insufficient documentation

## 2020-09-15 DIAGNOSIS — S63635D Sprain of interphalangeal joint of left ring finger, subsequent encounter: Secondary | ICD-10-CM | POA: Insufficient documentation

## 2020-09-15 DIAGNOSIS — W230XXD Caught, crushed, jammed, or pinched between moving objects, subsequent encounter: Secondary | ICD-10-CM | POA: Insufficient documentation

## 2020-09-15 DIAGNOSIS — S63637D Sprain of interphalangeal joint of left little finger, subsequent encounter: Secondary | ICD-10-CM

## 2020-09-15 DIAGNOSIS — S6992XD Unspecified injury of left wrist, hand and finger(s), subsequent encounter: Secondary | ICD-10-CM | POA: Diagnosis present

## 2020-09-15 NOTE — Discharge Instructions (Addendum)
Continue to wear finger splint as you are injury may involve your tendon.  Follow-up with hand specialist as needed.  You may take over-the-counter Tylenol or ibuprofen as needed for pain.

## 2020-09-15 NOTE — ED Provider Notes (Signed)
[facility] EMERGENCY DEPARTMENT Provider Note   CSN: [id number] Arrival date & time: 09/15/20  1038     History Chief Complaint  Patient presents with   Finger Injury    Rose Willis is a 35 y.o. female.  The history is provided by the patient and medical records. No language interpreter was used.   35 year old female significant history of fibromyalgia, carpal tunnel syndrome, who presents for evaluation of finger injury.  On 6/10, patient accidentally crushed her left ring finger in a sliding glass door and suffered a laceration.  Patient was given antibiotic for her crush injury.  Patient states since then she is still having pain and numbness about her finger.  She is having difficulty flexing digit.  She is under the impression that her bone was completely crushed.  She report pain still persist prompting this ER visit.  She is right-hand dominant.  She is having difficulty working due to her finger discomfort.  Past Medical History:  Diagnosis Date   Adjustment disorder with mixed anxiety and depressed mood    Carpal tunnel syndrome    Chronic bronchitis (HCC)    Chronic right shoulder pain    Fibromyalgia    Panic attack    Scoliosis     There are no problems to display for this patient.   Past Surgical History:  Procedure Laterality Date   DILATION AND CURETTAGE OF UTERUS       OB History   No obstetric history on file.     History reviewed. No pertinent family history.  Social History   Tobacco Use   Smoking status: Every Day    Pack years: 0.00    Types: Cigarettes   Smokeless tobacco: Never  Vaping Use   Vaping Use: Never used  Substance Use Topics   Alcohol use: Yes   Drug use: No    Home Medications Prior to Admission medications   Medication Sig Start Date End Date Taking? Authorizing Provider  atenolol (TENORMIN) 25 MG tablet Take 25 mg by mouth daily.    [provider]  cephALEXin (KEFLEX) 500 MG capsule Take 1  capsule (500 mg total) by mouth 4 (four) times daily. 08/29/20   Molpus, John, MD  diclofenac (VOLTAREN) 75 MG EC tablet Take 75 mg by mouth 2 (two) times daily.    [provider]  fluconazole (DIFLUCAN) 150 MG tablet Take 1 tablet as needed for vaginal yeast infection.  May repeat in 3 days if symptoms persist. 08/29/20   Molpus, Jonny Ruiz, MD  gabapentin (NEURONTIN) 300 MG capsule Take 300 mg by mouth 3 (three) times daily.     [provider]  guaiFENesin (ROBITUSSIN) 100 MG/5ML liquid Take 5-10 mLs (100-200 mg total) by mouth every 4 (four) hours as needed for cough. 05/26/16   Ward, Layla Maw, DO  levonorgestrel (MIRENA) 20 MCG/24HR IUD 1 each by Intrauterine route once.    [provider]  Milnacipran (SAVELLA) 50 MG TABS tablet Take 50 mg by mouth 2 (two) times daily.    [provider]  SUMAtriptan (IMITREX) 100 MG tablet Take 100 mg by mouth every 2 (two) hours as needed for migraine. May repeat in 2 hours if headache persists or recurs.    [provider]  topiramate (TOPAMAX) 25 MG capsule Take 25 mg by mouth as directed.    [provider]    Allergies    Tolmetin, Prednisone, and Tramadol  Review of Systems   Review of Systems  Constitutional:  Negative for fever.  Musculoskeletal:  Positive for arthralgias.  Skin:  Negative for wound.  Neurological:  Positive for numbness.   Physical Exam Updated Vital Signs BP 117/87 (BP Location: Right Arm)   Pulse 87   Temp 98.4 F (36.9 C) (Oral)   Resp 18   Ht 5\' 1"  (1.549 m)   Wt 59 kg   SpO2 100%   BMI 24.58 kg/m   Physical Exam Vitals and nursing note reviewed.  Constitutional:      General: She is not in acute distress.    Appearance: She is well-developed.  HENT:     Head: Atraumatic.  Eyes:     Conjunctiva/sclera: Conjunctivae normal.  Pulmonary:     Effort: Pulmonary effort is normal.  Musculoskeletal:        General: Signs of injury (Left ring finger.  Tenderness  noted to DIP on palpation difficulty performing flexion at the joint but no deformity bruising swelling noted.  Brisk cap refill.  No signs of infection) present.     Cervical back: Neck supple.  Skin:    Findings: No rash.  Neurological:     Mental Status: She is alert.  Psychiatric:        Mood and Affect: Mood normal.    ED Results / Procedures / Treatments   Labs (all labs ordered are listed, but only abnormal results are displayed) Labs Reviewed - No data to display  EKG None  Radiology No results found.  Procedures Procedures   Medications Ordered in ED Medications - No data to display  ED Course  I have reviewed the triage vital signs and the nursing notes.  Pertinent labs & imaging results that were available during my care of the patient were reviewed by me and considered in my medical decision making (see chart for details).    MDM Rules/Calculators/A&P                          BP 117/87 (BP Location: Right Arm)   Pulse 87   Temp 98.4 F (36.9 C) (Oral)   Resp 18   Ht 5\' 1"  (1.549 m)   Wt 59 kg   SpO2 100%   BMI 24.58 kg/m   Final Clinical Impression(s) / ED Diagnoses Final diagnoses:  Sprain of interphalangeal joint of left little finger, subsequent encounter    Rx / DC Orders ED Discharge Orders     None      1:42 PM Patient endorsed pain to her left ring finger from a crushing injury approximately 2 weeks ago.  Initially seen and evaluated in the ED had x-ray that did not show any acute fracture or dislocation.  Discharged home with antibiotic and pain medication but continues to endorse some persistent pain about the DIP joints and having difficulty with flexion at the joint.  This could be indicative of a tendon injury.  Will place finger in a static finger splint and will give referral to hand specialist for outpatient care.   , PA-C 09/15/20 1408    Fayrene Helper, MD 09/15/20 [phone]

## 2020-09-15 NOTE — ED Triage Notes (Signed)
Left 4th digit injury x 1.5 weeks ago, seen here for same, no follow up care, states pain continued

## 2020-09-15 NOTE — ED Notes (Signed)
ED Provider at bedside. 

## 2021-01-24 ENCOUNTER — Other Ambulatory Visit: Payer: Self-pay

## 2021-01-24 ENCOUNTER — Encounter (HOSPITAL_BASED_OUTPATIENT_CLINIC_OR_DEPARTMENT_OTHER): Payer: Self-pay | Admitting: *Deleted

## 2021-01-24 DIAGNOSIS — R059 Cough, unspecified: Secondary | ICD-10-CM | POA: Insufficient documentation

## 2021-01-24 DIAGNOSIS — R109 Unspecified abdominal pain: Secondary | ICD-10-CM | POA: Insufficient documentation

## 2021-01-24 DIAGNOSIS — Z5321 Procedure and treatment not carried out due to patient leaving prior to being seen by health care provider: Secondary | ICD-10-CM | POA: Insufficient documentation

## 2021-01-24 LAB — COMPREHENSIVE METABOLIC PANEL
ALT: 16 U/L (ref 0–44)
AST: 22 U/L (ref 15–41)
Albumin: 3.9 g/dL (ref 3.5–5.0)
Alkaline Phosphatase: 96 U/L (ref 38–126)
Anion gap: 9 (ref 5–15)
BUN: 7 mg/dL (ref 6–20)
CO2: 26 mmol/L (ref 22–32)
Calcium: 9.3 mg/dL (ref 8.9–10.3)
Chloride: 98 mmol/L (ref 98–111)
Creatinine, Ser: 0.79 mg/dL (ref 0.44–1.00)
GFR, Estimated: >60 mL/min (ref >60)
Glucose, Bld: 149 mg/dL — ABNORMAL HIGH (ref 70–99)
Potassium: 3.4 mmol/L — ABNORMAL LOW (ref 3.5–5.1)
Sodium: 133 mmol/L — ABNORMAL LOW (ref 135–145)
Total Bilirubin: 0.4 mg/dL (ref 0.3–1.2)
Total Protein: 8.4 g/dL — ABNORMAL HIGH (ref 6.5–8.1)

## 2021-01-24 LAB — URINALYSIS, MICROSCOPIC (REFLEX)

## 2021-01-24 LAB — URINALYSIS, ROUTINE W REFLEX MICROSCOPIC
Glucose, UA: NEGATIVE mg/dL
Ketones, ur: 80 mg/dL — AB
Nitrite: NEGATIVE
Protein, ur: 30 mg/dL — AB
Specific Gravity, Urine: 1.025 (ref 1.005–1.030)
pH: 6 (ref 5.0–8.0)

## 2021-01-24 LAB — CBC
HCT: 40.6 % (ref 36.0–46.0)
Hemoglobin: 13.6 g/dL (ref 12.0–15.0)
MCH: 30.8 pg (ref 26.0–34.0)
MCHC: 33.5 g/dL (ref 30.0–36.0)
MCV: 91.9 fL (ref 80.0–100.0)
Platelets: 377 10*3/uL (ref 150–400)
RBC: 4.42 MIL/uL (ref 3.87–5.11)
RDW: 12.1 % (ref 11.5–15.5)
WBC: 18.6 10*3/uL — ABNORMAL HIGH (ref 4.0–10.5)
nRBC: 0 % (ref 0.0–0.2)

## 2021-01-24 LAB — LIPASE, BLOOD: Lipase: 33 U/L (ref 11–51)

## 2021-01-24 LAB — PREGNANCY, URINE: Preg Test, Ur: NEGATIVE

## 2021-01-24 NOTE — ED Notes (Signed)
HCG

## 2021-01-24 NOTE — ED Triage Notes (Signed)
Pt reports abdominal cramps x 1 week. Voices concern for  possible STI, pregnancy and/or ovarian cyst. States she is unsure of last menstrual cycle. Also reports cough x 2 weeks since starting to use vape

## 2021-01-25 ENCOUNTER — Emergency Department (HOSPITAL_BASED_OUTPATIENT_CLINIC_OR_DEPARTMENT_OTHER)
Admission: EM | Admit: 2021-01-25 | Discharge: 2021-01-25 | Disposition: A | Discharge status: Home / Self Care | Payer: Medicaid Other | Attending: Emergency Medicine | Admitting: Emergency Medicine

## 2021-01-25 ENCOUNTER — Encounter (HOSPITAL_BASED_OUTPATIENT_CLINIC_OR_DEPARTMENT_OTHER): Payer: Self-pay | Admitting: Emergency Medicine

## 2021-01-25 DIAGNOSIS — Z113 Encounter for screening for infections with a predominantly sexual mode of transmission: Secondary | ICD-10-CM | POA: Insufficient documentation

## 2021-01-25 DIAGNOSIS — N760 Acute vaginitis: Secondary | ICD-10-CM | POA: Insufficient documentation

## 2021-01-25 DIAGNOSIS — N3000 Acute cystitis without hematuria: Secondary | ICD-10-CM | POA: Insufficient documentation

## 2021-01-25 DIAGNOSIS — F17210 Nicotine dependence, cigarettes, uncomplicated: Secondary | ICD-10-CM | POA: Insufficient documentation

## 2021-01-25 DIAGNOSIS — B9689 Other specified bacterial agents as the cause of diseases classified elsewhere: Secondary | ICD-10-CM | POA: Insufficient documentation

## 2021-01-25 LAB — URINALYSIS, MICROSCOPIC (REFLEX)

## 2021-01-25 LAB — URINALYSIS, ROUTINE W REFLEX MICROSCOPIC
Bilirubin Urine: NEGATIVE
Glucose, UA: NEGATIVE mg/dL
Hgb urine dipstick: NEGATIVE
Ketones, ur: 80 mg/dL — AB
Nitrite: NEGATIVE
Protein, ur: NEGATIVE mg/dL
Specific Gravity, Urine: 1.01 (ref 1.005–1.030)
pH: 6.5 (ref 5.0–8.0)

## 2021-01-25 LAB — WET PREP, GENITAL
Sperm: NONE SEEN
Trich, Wet Prep: NONE SEEN
WBC, Wet Prep HPF POC: NONE SEEN
Yeast Wet Prep HPF POC: NONE SEEN

## 2021-01-25 LAB — PREGNANCY, URINE: Preg Test, Ur: NEGATIVE

## 2021-01-25 MED ORDER — METRONIDAZOLE 500 MG PO TABS: 500.0000 mg | ORAL_TABLET | Freq: Two times a day (BID) | ORAL | 0 refills | Status: AC

## 2021-01-25 MED ORDER — DOXYCYCLINE HYCLATE 100 MG PO CAPS: 100.0000 mg | ORAL_CAPSULE | Freq: Two times a day (BID) | ORAL | 0 refills | Status: AC

## 2021-01-25 MED ORDER — CEFTRIAXONE SODIUM 1 G IJ SOLR
1.0000 g | Freq: Once | INTRAMUSCULAR | Status: AC
  Administered 2021-01-25: 1 g via INTRAMUSCULAR
  Filled 2021-01-25: qty 10

## 2021-01-25 MED ORDER — LIDOCAINE HCL (PF) 1 % IJ SOLN
INTRAMUSCULAR | Status: AC
  Filled 2021-01-25: qty 5

## 2021-01-25 MED ORDER — CEPHALEXIN 500 MG PO CAPS: 500.0000 mg | ORAL_CAPSULE | Freq: Two times a day (BID) | ORAL | 0 refills | Status: DC

## 2021-01-25 MED ORDER — CEPHALEXIN 500 MG PO CAPS: 500.0000 mg | ORAL_CAPSULE | Freq: Two times a day (BID) | ORAL | 0 refills | Status: AC

## 2021-01-25 MED ORDER — METRONIDAZOLE 500 MG PO TABS: 500.0000 mg | ORAL_TABLET | Freq: Two times a day (BID) | ORAL | 0 refills | Status: DC

## 2021-01-25 NOTE — ED Triage Notes (Signed)
Pt arrives pov, c/o RLQ cramps x 1 week. Endorses concern for STI, pregnancy and/or ovarian cyst. States she is unsure of last menstrual cycle. Also reports cough x 2 weeks since starting to use vape. Pt left after triage yesterday

## 2021-01-25 NOTE — ED Notes (Signed)
PA at bedside.

## 2021-01-25 NOTE — ED Notes (Signed)
Called for room no answer

## 2021-01-25 NOTE — ED Notes (Signed)
Urine spec container provided to client

## 2021-01-25 NOTE — Discharge Instructions (Addendum)
You were seen in the emergency department today for abdominal pain.  As we discussed we are treating you for a urinary tract infection and bacterial vaginosis, and we are treating you ahead of time for suspected gonorrhea.  I am prescribing you 2 different antibiotics we gave you 1 dose of an antibiotic for gonorrhea here.  I am prescribing you 2 different antibiotics, 1 for the urinary tract infection, and 1 for the bacterial vaginosis.    I have also printed you a prescription for third antibiotic, that I only want you to fill and take if your chlamydia testing is positive.  It is important to finish the entire course of these antibiotics!  As we discussed you can follow-up the results of your testing on MyChart.  If your HIV or syphilis testing comes back positive, please follow-up with the health department for necessary treatment. Here is their address: [facility] [address] [place], [place] [address]  You can also take ibuprofen or Tylenol for pain.  Continue to monitor how you're doing and return to the ER for new or worsening symptoms such as fevers or worsening pain.   It has been a pleasure seeing and caring for you today and I hope you start feeling better soon!
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
## 2021-01-26 LAB — GC/CHLAMYDIA PROBE AMP (~~LOC~~) NOT AT ARMC
Chlamydia: NEGATIVE
Comment: NEGATIVE
Comment: NORMAL
Neisseria Gonorrhea: POSITIVE — AB

## 2021-01-26 LAB — HIV ANTIBODY (ROUTINE TESTING W REFLEX): HIV Screen 4th Generation wRfx: NONREACTIVE

## 2021-01-26 LAB — RPR: RPR Ser Ql: NONREACTIVE

## 2022-04-06 IMAGING — DX DG FINGER RING 2+V*L*
3 series · 3 of 3 positions shown · non-contrast
Comparison: None.

CLINICAL DATA: Closed fourth digit in door with pain and swelling,
initial encounter

EXAM:
LEFT RING FINGER 2+V

[finger ap]
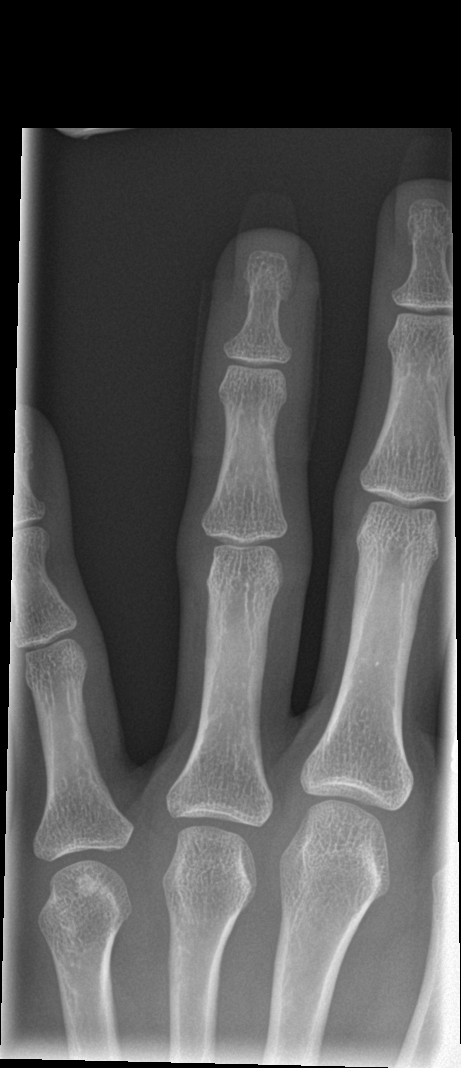

[finger obl]
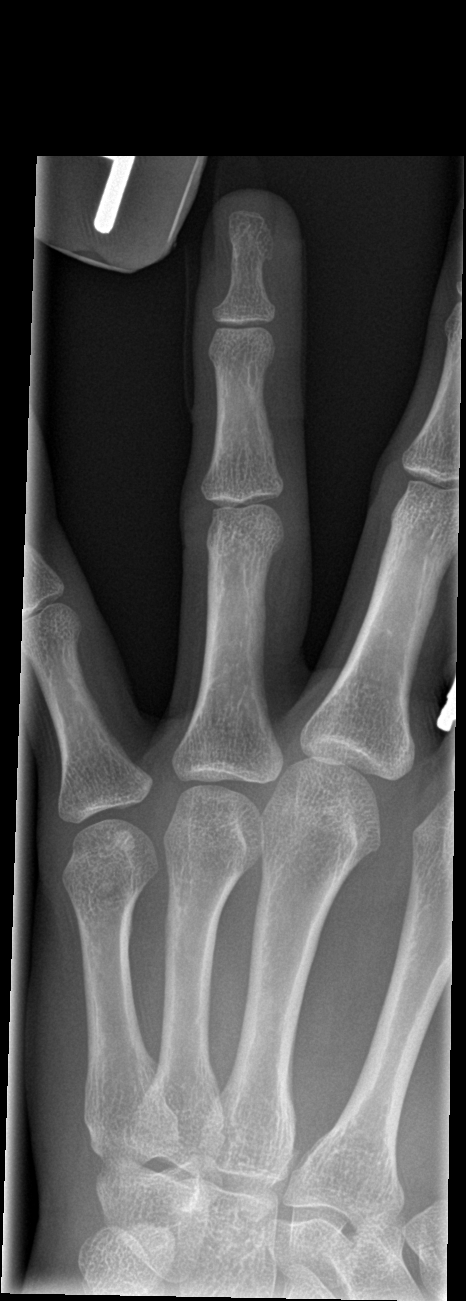

[finger lat]
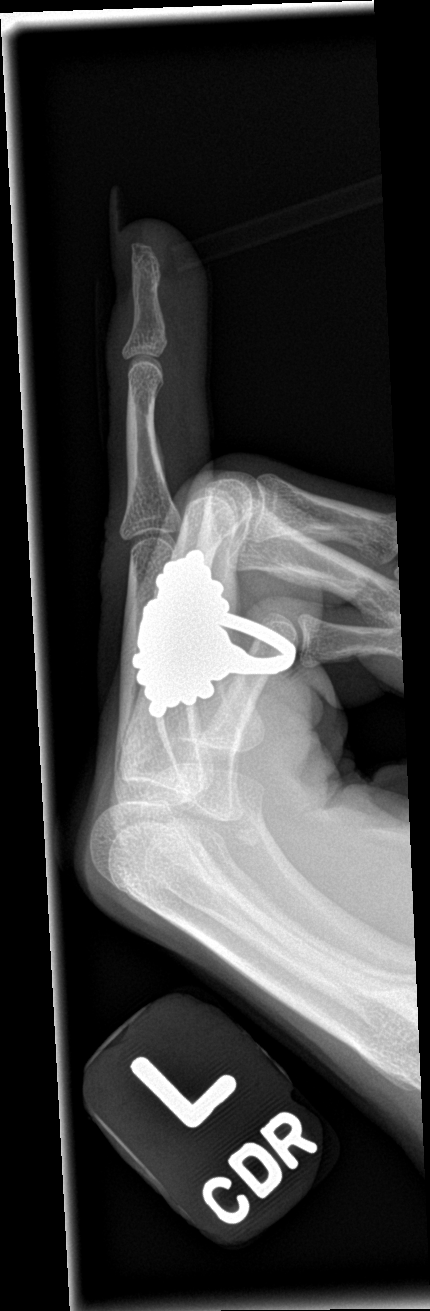

[3 of 3 positions shown; findings below may reference images not displayed]

FINDINGS: There is no evidence of fracture or dislocation. There is no
evidence of arthropathy or other focal bone abnormality. Soft
tissues are unremarkable.
IMPRESSION: No acute abnormality noted
